# Patient Record
Sex: Female | Born: 1955 | Race: White | Hispanic: No | Marital: Married | Smoking: Never smoker
Health system: Southern US, Community
[De-identification: ages and names within clinical notes are randomized; demographics above are authoritative.]

## PROBLEM LIST (undated history)

## (undated) DIAGNOSIS — T7840XA Allergy, unspecified, initial encounter: Secondary | ICD-10-CM

## (undated) HISTORY — PX: DILATION AND CURETTAGE OF UTERUS: SHX78

## (undated) HISTORY — DX: Allergy, unspecified, initial encounter: T78.40XA

---

## 2002-08-29 DIAGNOSIS — J309 Allergic rhinitis, unspecified: Secondary | ICD-10-CM | POA: Insufficient documentation

## 2004-01-29 DIAGNOSIS — C760 Malignant neoplasm of head, face and neck: Secondary | ICD-10-CM | POA: Insufficient documentation

## 2005-05-21 ENCOUNTER — Ambulatory Visit: Payer: Self-pay | Admitting: Family Medicine

## 2005-06-04 ENCOUNTER — Ambulatory Visit: Payer: Self-pay | Admitting: Family Medicine

## 2006-01-16 ENCOUNTER — Ambulatory Visit: Payer: Self-pay | Admitting: Family Medicine

## 2006-07-31 ENCOUNTER — Ambulatory Visit: Payer: Self-pay | Admitting: Family Medicine

## 2006-08-12 ENCOUNTER — Ambulatory Visit: Payer: Self-pay | Admitting: Family Medicine

## 2007-03-10 ENCOUNTER — Ambulatory Visit: Payer: Self-pay | Admitting: Family Medicine

## 2007-11-04 ENCOUNTER — Ambulatory Visit: Payer: Self-pay | Admitting: Family Medicine

## 2008-05-24 ENCOUNTER — Ambulatory Visit: Payer: Self-pay | Admitting: Family Medicine

## 2009-06-13 ENCOUNTER — Ambulatory Visit: Payer: Self-pay | Admitting: Family Medicine

## 2009-09-05 ENCOUNTER — Ambulatory Visit: Payer: Self-pay | Admitting: Family Medicine

## 2009-12-13 ENCOUNTER — Ambulatory Visit: Payer: Self-pay | Admitting: Unknown Physician Specialty

## 2010-08-14 ENCOUNTER — Ambulatory Visit: Payer: Self-pay | Admitting: Family Medicine

## 2011-10-08 ENCOUNTER — Ambulatory Visit: Payer: Self-pay | Admitting: Family Medicine

## 2012-04-14 ENCOUNTER — Ambulatory Visit: Payer: Self-pay | Admitting: Family Medicine

## 2012-12-08 ENCOUNTER — Ambulatory Visit: Payer: Self-pay | Admitting: Family Medicine

## 2014-01-18 ENCOUNTER — Ambulatory Visit: Payer: Self-pay | Admitting: Family Medicine

## 2015-03-02 ENCOUNTER — Other Ambulatory Visit: Payer: Self-pay | Admitting: Family Medicine

## 2015-03-02 DIAGNOSIS — Z1231 Encounter for screening mammogram for malignant neoplasm of breast: Secondary | ICD-10-CM

## 2015-03-10 DIAGNOSIS — G43909 Migraine, unspecified, not intractable, without status migrainosus: Secondary | ICD-10-CM | POA: Insufficient documentation

## 2015-03-10 DIAGNOSIS — K649 Unspecified hemorrhoids: Secondary | ICD-10-CM | POA: Insufficient documentation

## 2015-03-14 ENCOUNTER — Ambulatory Visit: Payer: Self-pay

## 2015-04-04 ENCOUNTER — Ambulatory Visit
Admission: RE | Admit: 2015-04-04 | Discharge: 2015-04-04 | Disposition: A | Discharge status: Home / Self Care | Payer: BLUE CROSS/BLUE SHIELD | Source: Ambulatory Visit | Attending: Family Medicine | Admitting: Family Medicine

## 2015-04-04 DIAGNOSIS — Z1231 Encounter for screening mammogram for malignant neoplasm of breast: Secondary | ICD-10-CM | POA: Diagnosis not present

## 2015-04-21 ENCOUNTER — Other Ambulatory Visit: Payer: Self-pay | Admitting: Family Medicine

## 2015-04-21 DIAGNOSIS — G43909 Migraine, unspecified, not intractable, without status migrainosus: Secondary | ICD-10-CM

## 2015-05-23 ENCOUNTER — Encounter: Payer: Self-pay | Admitting: Family Medicine

## 2015-05-23 ENCOUNTER — Ambulatory Visit (INDEPENDENT_AMBULATORY_CARE_PROVIDER_SITE_OTHER): Payer: BLUE CROSS/BLUE SHIELD | Admitting: Family Medicine

## 2015-05-23 VITALS — BP 102/64 | HR 72 | Temp 98.0°F | Resp 16 | Ht 66.0 in | Wt 140.0 lb

## 2015-05-23 DIAGNOSIS — G43009 Migraine without aura, not intractable, without status migrainosus: Secondary | ICD-10-CM | POA: Diagnosis not present

## 2015-05-23 DIAGNOSIS — Z0000 Encounter for general adult medical examination without abnormal findings: Secondary | ICD-10-CM | POA: Diagnosis not present

## 2015-05-23 DIAGNOSIS — R319 Hematuria, unspecified: Secondary | ICD-10-CM

## 2015-05-23 DIAGNOSIS — R1032 Left lower quadrant pain: Secondary | ICD-10-CM | POA: Diagnosis not present

## 2015-05-23 LAB — POCT URINALYSIS DIPSTICK
BILIRUBIN UA: NEGATIVE
Glucose, UA: NEGATIVE
KETONES UA: NEGATIVE
LEUKOCYTES UA: NEGATIVE
Nitrite, UA: NEGATIVE
PH UA: 6
Protein, UA: NEGATIVE
SPEC GRAV UA: 1.02
Urobilinogen, UA: 0.2

## 2015-05-23 NOTE — Progress Notes (Signed)
Patient ID: AMEY HOSSAIN, female   DOB: 05-28-1956, 59 y.o.   MRN: [id number]        Patient: Megan Jensen, Female    DOB: [birth year], 59 y.o.   MRN: [id number] Visit Date: 05/23/2015  Today's Provider: Margarita Rana, MD   Chief Complaint  Patient presents with  . Annual Exam  . Headache   Subjective:    Annual physical exam Megan Jensen is a 59 y.o. female who presents today for health maintenance and complete physical. She feels well. She reports exercising 3-4 times a week. She reports she is sleeping well.  12/11/13 CPE 04/04/15 Mammogram-normal 05/26/12 Pap-neg; HPV-neg 12/13/09 Colonoscopy-WNL, recheck in 10 yrs 09/05/09 BMD-normal    .    Migraine   The onset of her migraine headaches has been gradual and they have been occuring in an  intermittent pattern. The course has been increasing. Patient describes her symptoms as  severe and worsening for the past few months.   Also concerned about left lower quadrant pain. Had it for several months, resolved and now is back.   -----------------------------------------------------------------   Review of Systems  Constitutional: Negative.   Eyes: Positive for photophobia.  Respiratory: Negative.   Cardiovascular: Negative.   Gastrointestinal: Negative.   Endocrine: Negative.   Genitourinary: Negative.   Musculoskeletal: Negative.   Skin: Negative.   Allergic/Immunologic: Negative.   Neurological: Positive for headaches.  Hematological: Negative.   Psychiatric/Behavioral: Negative.     Social History She  reports that she has never smoked. She has never used smokeless tobacco. She reports that she drinks alcohol. She reports that she does not use illicit drugs. Social History   Social History  . Marital Status: Married    Spouse Name: Printmaker  . Number of Children: 3  . Years of Education: N/A   Social History Main Topics  . Smoking status: Never Smoker   . Smokeless tobacco: Never Used  .  Alcohol Use: Yes     Comment: occasional  . Drug Use: No  . Sexual Activity: Not Asked   Other Topics Concern  . None   Social History Narrative    Patient Active Problem List   Diagnosis Date Noted  . Hemorrhoid 03/10/2015  . Headache, migraine 03/10/2015  . 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 Allergic rhinitis 08/29/2002    Past Surgical History  Procedure Laterality Date  . Cesarean section  1987  . Dilation and curettage of uterus      times 3     Family History  Family Status  Relation Status Death Age  . Mother Alive   . Father Alive   . Sister Alive   . Brother Alive    Her family history includes Arthritis in her father and mother; Hyperlipidemia in her mother; Hypertension in her father; Sleep apnea in her sister.    No Known Allergies  Previous Medications   HYDROCORTISONE (ANUSOL-HC) 25 MG SUPPOSITORY    Place rectally 2 (two) times daily as needed.    IBUPROFEN (ADVIL,MOTRIN) 800 MG TABLET    TAKE ONE TABLET BY MOUTH THREE TIMES A DAY AS NEEDED FOR HEADACHE   SUMATRIPTAN (IMITREX) 50 MG TABLET    Take 50 mg by mouth every 2 (two) hours as needed for migraine.     Patient Care Team: Margarita Rana, MD as PCP - General (Family Medicine)     Objective:   Vitals: BP 102/64 mmHg  Pulse 72  Temp(Src) 98 F (36.7  C)  Resp 16  Ht 5\' 6"  (1.676 m)  Wt 140 lb (63.504 kg)  BMI 22.61 kg/m2  SpO2 98%   Physical Exam  Constitutional: She is oriented to person, place, and time. She appears well-developed and well-nourished.  HENT:  Head: Normocephalic and atraumatic.  Right Ear: Tympanic membrane, external ear and ear canal normal.  Left Ear: Tympanic membrane, external ear and ear canal normal.  Nose: Nose normal.  Mouth/Throat: Uvula is midline, oropharynx is clear and moist and mucous membranes are normal.  Eyes: Conjunctivae, EOM and lids are normal. Pupils are equal, round, and reactive to light.  Neck: Trachea normal and normal range of motion. Neck  supple. Carotid bruit is not present. No thyroid mass and no thyromegaly present.  Cardiovascular: Normal rate, regular rhythm and normal heart sounds.   Pulmonary/Chest: Effort normal and breath sounds normal.  Abdominal: Soft. Normal appearance and bowel sounds are normal. There is no hepatosplenomegaly. There is no tenderness.  Genitourinary: No breast swelling, tenderness or discharge.  Musculoskeletal: Normal range of motion.  Lymphadenopathy:    She has no cervical adenopathy.    She has no axillary adenopathy.  Neurological: She is alert and oriented to person, place, and time. She has normal strength. No cranial nerve deficit.  Skin: Skin is warm, dry and intact.  Psychiatric: She has a normal mood and affect. Her speech is normal and behavior is normal. Judgment and thought content normal. Cognition and memory are normal.     Depression Screen PHQ 2/9 Scores 05/23/2015  PHQ - 2 Score 0      Assessment & Plan:     Routine Health Maintenance and Physical Exam  Exercise Activities and Dietary recommendations Goals    . Exercise 150 minutes per week (moderate activity)       Immunization History  Administered Date(s) Administered  . Tdap 03/16/2014    Health Maintenance  Topic Date Due  . Hepatitis C Screening  January 21, 1956  . HIV Screening  04/18/1971  . COLONOSCOPY  04/17/2006  . INFLUENZA VACCINE  04/30/2015  . MAMMOGRAM  04/03/2017  . TETANUS/TDAP  03/16/2024      1. Annual physical exam Continue to eat healthy and exercise.  - POCT urinalysis dipstick - CBC with Differential/Platelet - Comprehensive metabolic panel - Lipid Panel With LDL/HDL Ratio - TSH Results for orders placed or performed in visit on 05/23/15  POCT urinalysis dipstick  Result Value Ref Range   Color, UA straw    Clarity, UA clear    Glucose, UA neg    Bilirubin, UA neg    Ketones, UA neg    Spec Grav, UA 1.020    Blood, UA small    pH, UA 6.0    Protein, UA neg     Urobilinogen, UA 0.2    Nitrite, UA neg    Leukocytes, UA Negative Negative    2. Hematuria Will send to lab for evaluation.  - Urine Microscopic  3. Left lower quadrant pain Intermittent. No signs of  Diverticulosis on colonoscopy.  Will check ultrasound.  - US Pelvis Complete; Future  4. Migraine without aura and without status migrainosus, not intractable Worsening again. Will check sed rate. Would like patient to see a specialist. Going to talk with her daughter about who and we will refer.   - Sedimentation rate  Patient was seen and examined by Jerrell Belfast, MD, and note scribed by Lynford Humphrey, [place].   I have reviewed the document for  accuracy and completeness and I agree with above. Jerrell Belfast, MD     --------------------------------------------------------------------

## 2015-05-24 ENCOUNTER — Telehealth: Payer: Self-pay | Admitting: Family Medicine

## 2015-05-24 ENCOUNTER — Telehealth: Payer: Self-pay

## 2015-05-24 DIAGNOSIS — R102 Pelvic and perineal pain: Secondary | ICD-10-CM | POA: Insufficient documentation

## 2015-05-24 LAB — URINALYSIS, MICROSCOPIC ONLY: Casts: NONE SEEN /lpf

## 2015-05-24 NOTE — Telephone Encounter (Signed)
-----   Message from Margarita Rana, MD sent at 05/24/2015  7:39 AM EDT ----- Urine micro normal. Please notify patient. Thanks.

## 2015-05-24 NOTE — Telephone Encounter (Signed)
Pt advised.   Thanks,   -Laura  

## 2015-05-24 NOTE — Telephone Encounter (Signed)
Please add order for transvaginal ultrasound unless there is a reason pt should not have it done.This is usually done along with pelvic ultrasound

## 2015-05-29 ENCOUNTER — Ambulatory Visit
Admission: RE | Admit: 2015-05-29 | Discharge: 2015-05-29 | Disposition: A | Discharge status: Home / Self Care | Payer: BLUE CROSS/BLUE SHIELD | Source: Ambulatory Visit | Attending: Family Medicine | Admitting: Family Medicine

## 2015-05-29 DIAGNOSIS — R1032 Left lower quadrant pain: Secondary | ICD-10-CM | POA: Diagnosis not present

## 2015-05-29 DIAGNOSIS — R102 Pelvic and perineal pain: Secondary | ICD-10-CM

## 2015-05-30 ENCOUNTER — Telehealth: Payer: Self-pay

## 2015-05-30 NOTE — Telephone Encounter (Signed)
Pt advised.   Thanks,   -Kimie Pidcock  

## 2015-05-30 NOTE — Telephone Encounter (Signed)
-----   Message from Margarita Rana, MD sent at 05/29/2015  3:53 PM EDT ----- Normal pelvic ultrasound. Thanks.

## 2015-06-07 LAB — CBC WITH DIFFERENTIAL/PLATELET
BASOS: 1 %
Basophils Absolute: 0 10*3/uL (ref 0.0–0.2)
EOS (ABSOLUTE): 0.2 10*3/uL (ref 0.0–0.4)
Eos: 4 %
HEMOGLOBIN: 14.3 g/dL (ref 11.1–15.9)
Hematocrit: 42.2 % (ref 34.0–46.6)
IMMATURE GRANS (ABS): 0 10*3/uL (ref 0.0–0.1)
Immature Granulocytes: 0 %
LYMPHS ABS: 1.8 10*3/uL (ref 0.7–3.1)
LYMPHS: 39 %
MCH: 30.5 pg (ref 26.6–33.0)
MCHC: 33.9 g/dL (ref 31.5–35.7)
MCV: 90 fL (ref 79–97)
MONOCYTES: 11 %
Monocytes Absolute: 0.5 10*3/uL (ref 0.1–0.9)
NEUTROS ABS: 2.1 10*3/uL (ref 1.4–7.0)
Neutrophils: 45 %
Platelets: 205 10*3/uL (ref 150–379)
RBC: 4.69 x10E6/uL (ref 3.77–5.28)
RDW: 13.6 % (ref 12.3–15.4)
WBC: 4.6 10*3/uL (ref 3.4–10.8)

## 2015-06-07 LAB — COMPREHENSIVE METABOLIC PANEL
A/G RATIO: 1.9 (ref 1.1–2.5)
ALBUMIN: 4.6 g/dL (ref 3.5–5.5)
ALT: 29 IU/L (ref 0–32)
AST: 24 IU/L (ref 0–40)
Alkaline Phosphatase: 66 IU/L (ref 39–117)
BILIRUBIN TOTAL: 0.5 mg/dL (ref 0.0–1.2)
BUN / CREAT RATIO: 16 (ref 9–23)
BUN: 13 mg/dL (ref 6–24)
CHLORIDE: 100 mmol/L (ref 97–108)
CO2: 25 mmol/L (ref 18–29)
Calcium: 9.5 mg/dL (ref 8.7–10.2)
Creatinine, Ser: 0.81 mg/dL (ref 0.57–1.00)
GFR calc Af Amer: 92 mL/min/{1.73_m2} (ref >59)
GFR calc non Af Amer: 80 mL/min/{1.73_m2} (ref >59)
Globulin, Total: 2.4 g/dL (ref 1.5–4.5)
Glucose: 101 mg/dL — ABNORMAL HIGH (ref 65–99)
POTASSIUM: 4.3 mmol/L (ref 3.5–5.2)
Sodium: 141 mmol/L (ref 134–144)
TOTAL PROTEIN: 7 g/dL (ref 6.0–8.5)

## 2015-06-07 LAB — TSH: TSH: 5.36 u[IU]/mL — AB (ref 0.450–4.500)

## 2015-06-07 LAB — LIPID PANEL WITH LDL/HDL RATIO
Cholesterol, Total: 253 mg/dL — ABNORMAL HIGH (ref 100–199)
HDL: 63 mg/dL (ref >39)
LDL Calculated: 157 mg/dL — ABNORMAL HIGH (ref 0–99)
LDL/HDL RATIO: 2.5 ratio (ref 0.0–3.2)
Triglycerides: 164 mg/dL — ABNORMAL HIGH (ref 0–149)
VLDL CHOLESTEROL CAL: 33 mg/dL (ref 5–40)

## 2015-06-07 LAB — SEDIMENTATION RATE: Sed Rate: 2 mm/hr (ref 0–40)

## 2015-06-11 ENCOUNTER — Telehealth: Payer: Self-pay

## 2015-06-11 NOTE — Telephone Encounter (Signed)
LMTCB 06/11/2015  Thanks,   -Mickel Baas

## 2015-06-11 NOTE — Telephone Encounter (Signed)
Pt advised as directed below.  She will call back to get her lab slip for her TSH & T4 in about 6 weeks.   Thanks,   -Mickel Baas

## 2015-06-11 NOTE — Telephone Encounter (Signed)
Pt returned call and request that she be called back after 415 this afternoon. Thanks TNP

## 2015-06-11 NOTE — Telephone Encounter (Signed)
-----   Message from Margarita Rana, MD sent at 06/07/2015  1:43 PM EDT ----- Lab ok  With some minor abnormalities. Blood sugar slightly above normal. Recheck in 6 months for stability.  Cholesterol is elevated at 253 but has high good cholesterol and 10 year risk of heart disease is just 2.5% so medication in not recommended at this time.  Sed rate normal.  Thyroid very slightly low. Would recheck tsh and t4 in 6 weeks. Sometimes improves on it's own. Thanks.

## 2015-07-03 ENCOUNTER — Other Ambulatory Visit: Payer: Self-pay

## 2015-07-03 DIAGNOSIS — G43009 Migraine without aura, not intractable, without status migrainosus: Secondary | ICD-10-CM

## 2015-07-03 MED ORDER — SUMATRIPTAN SUCCINATE 50 MG PO TABS: ORAL_TABLET | ORAL | Status: DC

## 2015-10-11 ENCOUNTER — Ambulatory Visit (INDEPENDENT_AMBULATORY_CARE_PROVIDER_SITE_OTHER): Payer: BLUE CROSS/BLUE SHIELD | Admitting: Physician Assistant

## 2015-10-11 ENCOUNTER — Encounter: Payer: Self-pay | Admitting: Physician Assistant

## 2015-10-11 VITALS — BP 120/80 | HR 82 | Temp 98.2°F | Resp 16 | Wt 142.6 lb

## 2015-10-11 DIAGNOSIS — R05 Cough: Secondary | ICD-10-CM | POA: Diagnosis not present

## 2015-10-11 DIAGNOSIS — R946 Abnormal results of thyroid function studies: Secondary | ICD-10-CM

## 2015-10-11 DIAGNOSIS — J40 Bronchitis, not specified as acute or chronic: Secondary | ICD-10-CM

## 2015-10-11 DIAGNOSIS — R059 Cough, unspecified: Secondary | ICD-10-CM

## 2015-10-11 MED ORDER — HYDROCODONE-HOMATROPINE 5-1.5 MG/5ML PO SYRP: 5.0000 mL | ORAL_SOLUTION | Freq: Three times a day (TID) | ORAL | Status: DC | PRN

## 2015-10-11 MED ORDER — AZITHROMYCIN 250 MG PO TABS: ORAL_TABLET | ORAL | Status: DC

## 2015-10-11 NOTE — Progress Notes (Signed)
Patient: Megan Jensen Female    DOB: [birth year]   60 y.o.   MRN: [id number] Visit Date: 10/11/2015  Today's Provider: Mar Daring, PA-C   Chief Complaint  Patient presents with  . Cough   Subjective:    Cough This is a new problem. The current episode started 1 to 4 weeks ago. The problem has been gradually worsening. The problem occurs constantly. The cough is productive of sputum (Greenish). Associated symptoms include postnasal drip. Pertinent negatives include no chest pain, chills, ear congestion, ear pain, fever, headaches, nasal congestion, rhinorrhea, sore throat (scratchy), shortness of breath or wheezing. The symptoms are aggravated by lying down (Talking). She has tried OTC cough suppressant (Mucinex) for the symptoms. The treatment provided no relief.  Patient got her Influenza vaccine at work. 06/2015     No Known Allergies Previous Medications   HYDROCORTISONE (ANUSOL-HC) 25 MG SUPPOSITORY    Place rectally 2 (two) times daily as needed.    IBUPROFEN (ADVIL,MOTRIN) 800 MG TABLET    TAKE ONE TABLET BY MOUTH THREE TIMES A DAY AS NEEDED FOR HEADACHE   SUMATRIPTAN (IMITREX) 50 MG TABLET    Take one tablet at first sign of migraine symptoms.  If no relief, a second tablet may be taken in 2 hours limit 2/Day 5/Week.    Review of Systems  Constitutional: Positive for fatigue. Negative for fever and chills.  HENT: Positive for postnasal drip and sinus pressure. Negative for ear pain, rhinorrhea, sneezing and sore throat (scratchy).   Respiratory: Positive for cough. Negative for chest tightness, shortness of breath and wheezing.   Cardiovascular: Negative for chest pain.  Gastrointestinal: Negative for nausea, vomiting and abdominal pain.  Neurological: Negative for dizziness and headaches.    Social History  Substance Use Topics  . Smoking status: Never Smoker   . Smokeless tobacco: Never Used  . Alcohol Use: Yes     Comment: occasional   Objective:     BP 120/80 mmHg  Pulse 82  Temp(Src) 98.2 F (36.8 C) (Oral)  Resp 16  Wt 142 lb 9.6 oz (64.683 kg)  Physical Exam  Constitutional: She appears well-developed and well-nourished. No distress.  HENT:  Head: Normocephalic and atraumatic.  Right Ear: Hearing, tympanic membrane, external ear and ear canal normal.  Left Ear: Hearing, tympanic membrane, external ear and ear canal normal.  Nose: Mucosal edema and rhinorrhea present. Right sinus exhibits no maxillary sinus tenderness and no frontal sinus tenderness. Left sinus exhibits no maxillary sinus tenderness and no frontal sinus tenderness.  Mouth/Throat: Uvula is midline, oropharynx is clear and moist and mucous membranes are normal. No oropharyngeal exudate, posterior oropharyngeal edema or posterior oropharyngeal erythema.  Eyes: Conjunctivae are normal. Pupils are equal, round, and reactive to light. Right eye exhibits no discharge. Left eye exhibits no discharge. No scleral icterus.  Neck: Normal range of motion. Neck supple. No tracheal deviation present. No thyromegaly present.  Cardiovascular: Normal rate, regular rhythm and normal heart sounds.  Exam reveals no gallop and no friction rub.   No murmur heard. Pulmonary/Chest: Effort normal. No stridor. No respiratory distress. She has no decreased breath sounds. She has no wheezes. She has no rhonchi. She has rales in the right upper field and the right middle field.  Lymphadenopathy:    She has no cervical adenopathy.  Skin: Skin is warm and dry. She is not diaphoretic.  Vitals reviewed.       Assessment & Plan:  1. Bronchitis Worsening symptoms. I will be with a Z-Pak as below. I did advise that she may take Delsym for cough suppression during the day. She needs to make sure to stay well-hydrated and to include plenty of rest. She is to call the office if symptoms don't improve or worsen. - azithromycin (ZITHROMAX) 250 MG tablet; Take 2 tablets PO on day one, and one  tablet PO daily thereafter until completed.  Dispense: 6 tablet; Refill: 0  2. Cough Worsening nighttime cough that is affecting her sleep. I will prescribe Hycodan cough syrup as below. She is to call the office if her cough worsens or if she develops any wheezing or shortness of breath. May consider chest x-ray if cough worsens. She is to call the office if she does not improve following treatment. - HYDROcodone-homatropine (HYCODAN) 5-1.5 MG/5ML syrup; Take 5 mLs by mouth every 8 (eight) hours as needed for cough.  Dispense: 240 mL; Refill: 0  3. Abnormal results of thyroid function studies She does have a history of abnormal TSH on most recent lab draw that was done with her annual physical. She was to have her thyroid panel repeated in November but felt to have this done and would like to have it checked now. I will order thyroid panel with TSH as below and follow-up pending these results. - Thyroid Panel With TSH       Mar Daring, PA-C  [facility]

## 2015-11-30 ENCOUNTER — Other Ambulatory Visit: Payer: Self-pay | Admitting: Family Medicine

## 2015-11-30 DIAGNOSIS — G43009 Migraine without aura, not intractable, without status migrainosus: Secondary | ICD-10-CM

## 2015-12-13 ENCOUNTER — Telehealth: Payer: Self-pay

## 2015-12-13 LAB — THYROID PANEL WITH TSH
Free Thyroxine Index: 2.2 (ref 1.2–4.9)
T3 Uptake Ratio: 30 % (ref 24–39)
T4 TOTAL: 7.3 ug/dL (ref 4.5–12.0)
TSH: 2.22 u[IU]/mL (ref 0.450–4.500)

## 2015-12-13 NOTE — Telephone Encounter (Signed)
-----   Message from Mar Daring, PA-C sent at 12/13/2015  9:40 AM EDT ----- TSH and thyroid panel are all WNL.

## 2015-12-13 NOTE — Telephone Encounter (Signed)
Patient advised as directed below. 

## 2016-02-11 ENCOUNTER — Other Ambulatory Visit: Payer: Self-pay | Admitting: Family Medicine

## 2016-02-11 DIAGNOSIS — G43009 Migraine without aura, not intractable, without status migrainosus: Secondary | ICD-10-CM

## 2016-03-06 ENCOUNTER — Other Ambulatory Visit: Payer: Self-pay | Admitting: Family Medicine

## 2016-03-06 DIAGNOSIS — G43009 Migraine without aura, not intractable, without status migrainosus: Secondary | ICD-10-CM

## 2016-03-17 ENCOUNTER — Other Ambulatory Visit: Payer: Self-pay | Admitting: Family Medicine

## 2016-03-17 DIAGNOSIS — K649 Unspecified hemorrhoids: Secondary | ICD-10-CM

## 2016-04-17 ENCOUNTER — Ambulatory Visit (INDEPENDENT_AMBULATORY_CARE_PROVIDER_SITE_OTHER): Payer: BLUE CROSS/BLUE SHIELD | Admitting: Physician Assistant

## 2016-04-17 ENCOUNTER — Encounter: Payer: Self-pay | Admitting: Physician Assistant

## 2016-04-17 VITALS — BP 110/78 | HR 80 | Temp 98.3°F | Resp 16 | Wt 142.8 lb

## 2016-04-17 DIAGNOSIS — H6123 Impacted cerumen, bilateral: Secondary | ICD-10-CM | POA: Diagnosis not present

## 2016-04-17 NOTE — Progress Notes (Signed)
       Patient: Megan Jensen Female    DOB: May 06, 1956   60 y.o.   MRN: [id number] Visit Date: 04/17/2016  Today's Provider: Mar Daring, PA-C   Chief Complaint  Patient presents with  . Ear Fullness   Subjective:    HPI Ear Fullness: Patient reports that her left ear feels like there's water or wax stuck. [place] and has had the sensation since. She denies pain. Does have muffled hearing. Did use a wax removal drop in left ear only. Is flying to [place] next week and wanted to make sure that her ear drums were ok. No fevers, chills, nausea, vomiting or URI symptoms.    No Known Allergies Current Meds  Medication Sig  . ANUCORT-HC 25 MG suppository INSERT 1 RECTALLY TWICE A DAY  . ibuprofen (ADVIL,MOTRIN) 800 MG tablet TAKE ONE TABLET BY MOUTH THREE TIMES A DAY AS NEEDED FOR HEADACHE  . SUMAtriptan (IMITREX) 50 MG tablet TAKE ONE TABLET AT FIRST SIGN OF MIGRAINE SYMPTOMS--IF NO RELIEF, A SECOND TABLET MAY BE TAKEN IN 2 HOURS    Review of Systems  Constitutional: Negative.   HENT: Positive for ear pain (fullness) and hearing loss (muffled on left). Negative for ear discharge, postnasal drip, rhinorrhea, sinus pressure, sneezing, sore throat, tinnitus and trouble swallowing.   Respiratory: Negative.   Cardiovascular: Negative.   Gastrointestinal: Negative.   Neurological: Negative for dizziness and headaches.    Social History  Substance Use Topics  . Smoking status: Never Smoker   . Smokeless tobacco: Never Used  . Alcohol Use: Yes     Comment: occasional   Objective:   BP 110/78 mmHg  Pulse 80  Temp(Src) 98.3 F (36.8 C) (Oral)  Resp 16  Wt 142 lb 12.8 oz (64.774 kg)  Physical Exam  Constitutional: She appears well-developed and well-nourished. No distress.  HENT:  Head: Normocephalic and atraumatic.  Right Ear: Hearing and external ear normal.  Left Ear: Hearing, tympanic membrane, external ear and ear canal normal.  Nose:  Nose normal.  Mouth/Throat: Uvula is midline, oropharynx is clear and moist and mucous membranes are normal. No oropharyngeal exudate.  Cerumen impaction noted bilaterally. Ear lavage was successful on the left and TM was normal. Unable to remove wax on the right.  Eyes: Conjunctivae are normal. Pupils are equal, round, and reactive to light. Right eye exhibits no discharge. Left eye exhibits no discharge. No scleral icterus.  Neck: Normal range of motion. Neck supple. No tracheal deviation present. No thyromegaly present.  Cardiovascular: Normal rate, regular rhythm and normal heart sounds.  Exam reveals no gallop and no friction rub.   No murmur heard. Pulmonary/Chest: Effort normal and breath sounds normal. No stridor. No respiratory distress. She has no wheezes. She has no rales.  Lymphadenopathy:    She has no cervical adenopathy.  Skin: Skin is warm and dry. She is not diaphoretic.  Vitals reviewed.     Assessment & Plan:     1. Bilateral impacted cerumen Lavage on the left was successful. Right unsuccessful. Will have her statrt DeBrox drops in the right ear through the weekend. I will see her back Monday to recheck right ear and remove wax if still in place.  - Ear Lavage       Mar Daring, PA-C  [place]

## 2016-04-17 NOTE — Patient Instructions (Signed)
Cerumen Impaction The structures of the external ear canal secrete a waxy substance known as cerumen. Excess cerumen can build up in the ear canal, causing a condition known as cerumen impaction. Cerumen impaction can cause ear pain and disrupt the function of the ear. The rate of cerumen production differs for each individual. In certain individuals, the configuration of the ear canal may decrease his or her ability to naturally remove cerumen. CAUSES Cerumen impaction is caused by excessive cerumen production or buildup. RISK FACTORS  Frequent use of swabs to clean ears.  Having narrow ear canals.  Having eczema.  Being dehydrated. SIGNS AND SYMPTOMS  Diminished hearing.  Ear drainage.  Ear pain.  Ear itch. TREATMENT Treatment may involve:  Over-the-counter or prescription ear drops to soften the cerumen.  Removal of cerumen by a health care provider. This may be done with:  Irrigation with warm water. This is the most common method of removal.  Ear curettes and other instruments.  Surgery. This may be done in severe cases. HOME CARE INSTRUCTIONS  Take medicines only as directed by your health care provider.  Do not insert objects into the ear with the intent of cleaning the ear. PREVENTION  Do not insert objects into the ear, even with the intent of cleaning the ear. Removing cerumen as a part of normal hygiene is not necessary, and the use of swabs in the ear canal is not recommended.  Drink enough water to keep your urine clear or pale yellow.  Control your eczema if you have it. SEEK MEDICAL CARE IF:  You develop ear pain.  You develop bleeding from the ear.  The cerumen does not clear after you use ear drops as directed.   This information is not intended to replace advice given to you by your health care provider. Make sure you discuss any questions you have with your health care provider.   Document Released: 10/23/2004 Document Revised: 10/06/2014  Document Reviewed: 05/02/2015 Elsevier Interactive Patient Education 2016 Elsevier Inc. Carbamide Peroxide ear solution What is this medicine? CARBAMIDE PEROXIDE (CAR bah mide per OX ide) is used to soften and help remove ear wax. This medicine may be used for other purposes; ask your health care provider or pharmacist if you have questions. What should I tell my health care provider before I take this medicine? They need to know if you have any of these conditions: -dizziness -ear discharge -ear pain, irritation or rash -infection -perforated eardrum (hole in eardrum) -an unusual or allergic reaction to carbamide peroxide, glycerin, hydrogen peroxide, other medicines, foods, dyes, or preservatives -pregnant or trying to get pregnant -breast-feeding How should I use this medicine? This medicine is only for use in the outer ear canal. Follow the directions carefully. Wash hands before and after use. The solution may be warmed by holding the bottle in the hand for 1 to 2 minutes. Lie with the affected ear facing upward. Place the proper number of drops into the ear canal. After the drops are instilled, remain lying with the affected ear upward for 5 minutes to help the drops stay in the ear canal. A cotton ball may be gently inserted at the ear opening for no longer than 5 to 10 minutes to ensure retention. Repeat, if necessary, for the opposite ear. Do not touch the tip of the dropper to the ear, fingertips, or other surface. Do not rinse the dropper after use. Keep container tightly closed. Talk to your pediatrician regarding the use of this medicine   in children. While this drug may be used in children as young as 12 years for selected conditions, precautions do apply. Overdosage: If you think you have taken too much of this medicine contact a poison control center or emergency room at once. NOTE: This medicine is only for you. Do not share this medicine with others. What if I miss a dose? If  you miss a dose, use it as soon as you can. If it is almost time for your next dose, use only that dose. Do not use double or extra doses. What may interact with this medicine? Interactions are not expected. Do not use any other ear products without asking your doctor or health care professional. This list may not describe all possible interactions. Give your health care provider a list of all the medicines, herbs, non-prescription drugs, or dietary supplements you use. Also tell them if you smoke, drink alcohol, or use illegal drugs. Some items may interact with your medicine. What should I watch for while using this medicine? This medicine is not for long-term use. Do not use for more than 4 days without checking with your health care professional. Contact your doctor or health care professional if your condition does not start to get better within a few days or if you notice burning, redness, itching or swelling. What side effects may I notice from receiving this medicine? Side effects that you should report to your doctor or health care professional as soon as possible: -allergic reactions like skin rash, itching or hives, swelling of the face, lips, or tongue -burning, itching, and redness -worsening ear pain -rash Side effects that usually do not require medical attention (report to your doctor or health care professional if they continue or are bothersome): -abnormal sensation while putting the drops in the ear -temporary reduction in hearing (but not complete loss of hearing) This list may not describe all possible side effects. Call your doctor for medical advice about side effects. You may report side effects to FDA at 1-800-FDA-1088. Where should I keep my medicine? Keep out of the reach of children. Store at room temperature between 15 and 30 degrees C (59 and 86 degrees F) in a tight, light-resistant container. Keep bottle away from excessive heat and direct sunlight. Throw away any  unused medicine after the expiration date. NOTE: This sheet is a summary. It may not cover all possible information. If you have questions about this medicine, talk to your doctor, pharmacist, or health care provider.    2016, Elsevier/Gold Standard. (2007-12-28 14:00:02)  

## 2016-04-21 ENCOUNTER — Ambulatory Visit (INDEPENDENT_AMBULATORY_CARE_PROVIDER_SITE_OTHER): Payer: BLUE CROSS/BLUE SHIELD | Admitting: Physician Assistant

## 2016-04-21 ENCOUNTER — Encounter: Payer: Self-pay | Admitting: Physician Assistant

## 2016-04-21 VITALS — BP 116/76 | HR 66 | Temp 97.8°F | Wt 145.0 lb

## 2016-04-21 DIAGNOSIS — H6123 Impacted cerumen, bilateral: Secondary | ICD-10-CM

## 2016-04-21 NOTE — Progress Notes (Signed)
       Patient: Megan Jensen Female    DOB: 1956-01-14   60 y.o.   MRN: [id number] Visit Date: 04/21/2016  Today's Provider: Mar Daring, PA-C   Chief Complaint  Patient presents with  . Cerumen Impaction   Subjective:    HPI Pt is here for a a 4 day follow up of cerumen impaction. She has cerumen impacted in both ears but the left ear lavage was successful the right ear was not. She was to try Debrox over the weekend and recheck today for lavage. She reports that she has been doing the Debrox but has not seen any wax come out of her ear. She reports that the right ear never felt full.     No Known Allergies Current Meds  Medication Sig  . ANUCORT-HC 25 MG suppository INSERT 1 RECTALLY TWICE A DAY  . ibuprofen (ADVIL,MOTRIN) 800 MG tablet TAKE ONE TABLET BY MOUTH THREE TIMES A DAY AS NEEDED FOR HEADACHE  . SUMAtriptan (IMITREX) 50 MG tablet TAKE ONE TABLET AT FIRST SIGN OF MIGRAINE SYMPTOMS--IF NO RELIEF, A SECOND TABLET MAY BE TAKEN IN 2 HOURS    Review of Systems  Constitutional: Negative.   HENT: Positive for congestion, rhinorrhea and sore throat.   Eyes: Negative.   Respiratory: Positive for cough.   Cardiovascular: Negative.   Gastrointestinal: Negative.   Endocrine: Negative.   Genitourinary: Negative.   Musculoskeletal: Negative.   Allergic/Immunologic: Negative.   Neurological: Negative.   Hematological: Negative.   Psychiatric/Behavioral: Negative.     Social History  Substance Use Topics  . Smoking status: Never Smoker  . Smokeless tobacco: Never Used  . Alcohol use Yes     Comment: occasional   Objective:   BP 116/76 (BP Location: Right Arm, Patient Position: Sitting, Cuff Size: Normal)   Pulse 66   Temp 97.8 F (36.6 C) (Oral)   Wt 145 lb (65.8 kg)   BMI 23.40 kg/m   Physical Exam  Constitutional: She appears well-developed and well-nourished. No distress.  HENT:  Head: Normocephalic and atraumatic.  Right Ear: Hearing, tympanic  membrane, external ear and ear canal normal.  Left Ear: Hearing, tympanic membrane, external ear and ear canal normal.  Nose: Nose normal.  Mouth/Throat: Uvula is midline, oropharynx is clear and moist and mucous membranes are normal. No oropharyngeal exudate.  Skin: Skin is warm and dry. She is not diaphoretic.  Vitals reviewed.     Assessment & Plan:     1. Cerumen impaction, bilateral Left ear was successfully lavaged on Friday. Right ear she has been using debrox in all weekend to soften wax. She comes in today. On exam right ear is now clear and TM WNL. No further treatment required.        Mar Daring, PA-C  [facility]

## 2016-06-04 ENCOUNTER — Other Ambulatory Visit: Payer: Self-pay | Admitting: Internal Medicine

## 2016-06-04 DIAGNOSIS — Z1231 Encounter for screening mammogram for malignant neoplasm of breast: Secondary | ICD-10-CM

## 2016-06-19 ENCOUNTER — Other Ambulatory Visit: Payer: Self-pay | Admitting: Internal Medicine

## 2016-06-19 ENCOUNTER — Ambulatory Visit: Payer: BLUE CROSS/BLUE SHIELD

## 2016-06-19 ENCOUNTER — Ambulatory Visit
Admission: RE | Admit: 2016-06-19 | Discharge: 2016-06-19 | Disposition: A | Discharge status: Home / Self Care | Payer: BLUE CROSS/BLUE SHIELD | Source: Ambulatory Visit | Attending: Internal Medicine | Admitting: Internal Medicine

## 2016-06-19 DIAGNOSIS — Z1231 Encounter for screening mammogram for malignant neoplasm of breast: Secondary | ICD-10-CM | POA: Diagnosis present

## 2017-06-10 ENCOUNTER — Other Ambulatory Visit: Payer: Self-pay | Admitting: Internal Medicine

## 2017-06-10 DIAGNOSIS — Z1231 Encounter for screening mammogram for malignant neoplasm of breast: Secondary | ICD-10-CM

## 2017-08-26 ENCOUNTER — Ambulatory Visit
Admission: RE | Admit: 2017-08-26 | Discharge: 2017-08-26 | Disposition: A | Discharge status: Home / Self Care | Payer: BLUE CROSS/BLUE SHIELD | Source: Ambulatory Visit | Attending: Internal Medicine | Admitting: Internal Medicine

## 2017-08-26 DIAGNOSIS — Z1231 Encounter for screening mammogram for malignant neoplasm of breast: Secondary | ICD-10-CM | POA: Diagnosis not present

## 2018-08-10 ENCOUNTER — Other Ambulatory Visit: Payer: Self-pay | Admitting: Internal Medicine

## 2018-08-10 DIAGNOSIS — Z1231 Encounter for screening mammogram for malignant neoplasm of breast: Secondary | ICD-10-CM

## 2018-10-13 ENCOUNTER — Ambulatory Visit
Admission: RE | Admit: 2018-10-13 | Discharge: 2018-10-13 | Disposition: A | Discharge status: Home / Self Care | Payer: 59 | Source: Ambulatory Visit | Attending: Internal Medicine | Admitting: Internal Medicine

## 2018-10-13 DIAGNOSIS — Z1231 Encounter for screening mammogram for malignant neoplasm of breast: Secondary | ICD-10-CM | POA: Insufficient documentation

## 2019-11-25 ENCOUNTER — Other Ambulatory Visit: Payer: Self-pay | Admitting: Internal Medicine

## 2020-08-16 LAB — EXTERNAL GENERIC LAB PROCEDURE: COLOGUARD: NEGATIVE

## 2020-08-16 LAB — COLOGUARD: COLOGUARD: NEGATIVE

## 2020-10-07 ENCOUNTER — Other Ambulatory Visit: Payer: Self-pay

## 2020-10-07 ENCOUNTER — Other Ambulatory Visit: Payer: 59

## 2020-10-07 DIAGNOSIS — Z20822 Contact with and (suspected) exposure to covid-19: Secondary | ICD-10-CM

## 2020-10-11 LAB — NOVEL CORONAVIRUS, NAA: SARS-CoV-2, NAA: NOT DETECTED

## 2020-11-14 ENCOUNTER — Other Ambulatory Visit: Payer: Self-pay | Admitting: Internal Medicine

## 2020-11-14 DIAGNOSIS — Z1231 Encounter for screening mammogram for malignant neoplasm of breast: Secondary | ICD-10-CM

## 2021-10-30 ENCOUNTER — Other Ambulatory Visit: Payer: Self-pay

## 2021-10-30 ENCOUNTER — Ambulatory Visit
Admission: RE | Admit: 2021-10-30 | Discharge: 2021-10-30 | Disposition: A | Discharge status: Home / Self Care | Payer: Medicare HMO | Source: Ambulatory Visit | Attending: Internal Medicine | Admitting: Internal Medicine

## 2021-10-30 DIAGNOSIS — Z1231 Encounter for screening mammogram for malignant neoplasm of breast: Secondary | ICD-10-CM | POA: Insufficient documentation

## 2022-05-16 IMAGING — MG MM DIGITAL SCREENING BILAT W/ TOMO AND CAD
6 of 10 series · 6 of 30 positions shown · non-contrast
Comparison: Previous exam(s).

CLINICAL DATA: Screening.

EXAM:
DIGITAL SCREENING BILATERAL MAMMOGRAM WITH TOMOSYNTHESIS AND CAD
TECHNIQUE: Bilateral screening digital craniocaudal and mediolateral oblique
mammograms were obtained. Bilateral screening digital breast
tomosynthesis was performed. The images were evaluated with
computer-aided detection.

[R CC synth-2D]
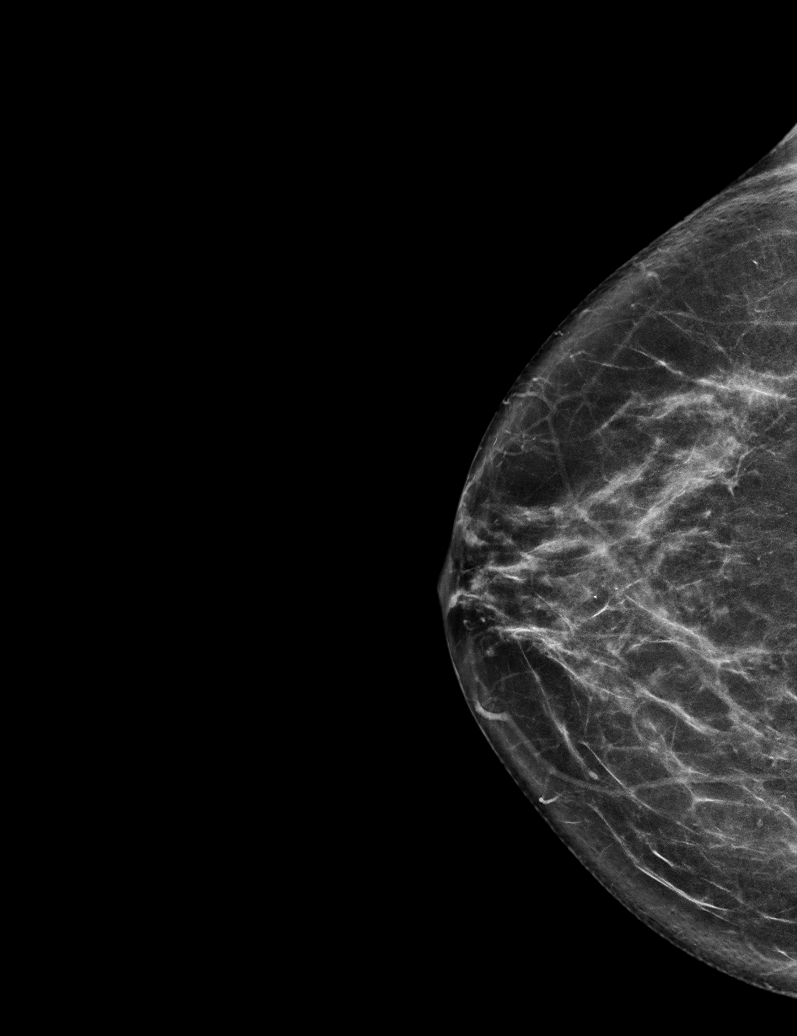

[L MLO synth-2D (1 of 2)]
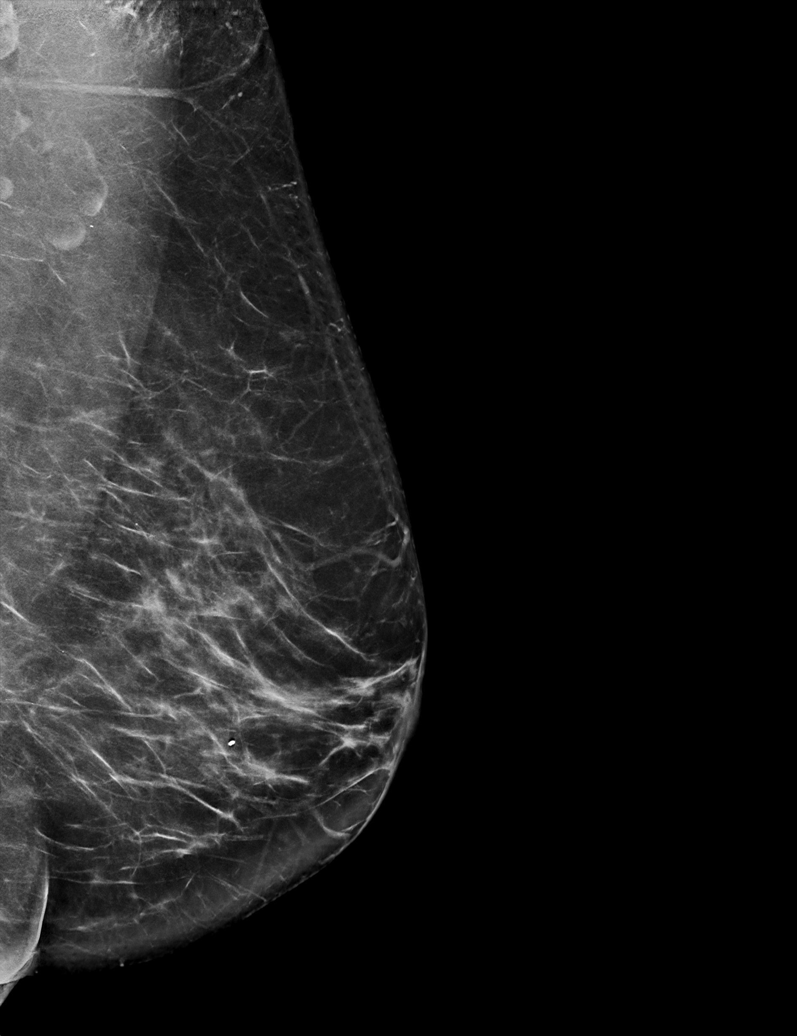

[R MLO synth-2D]
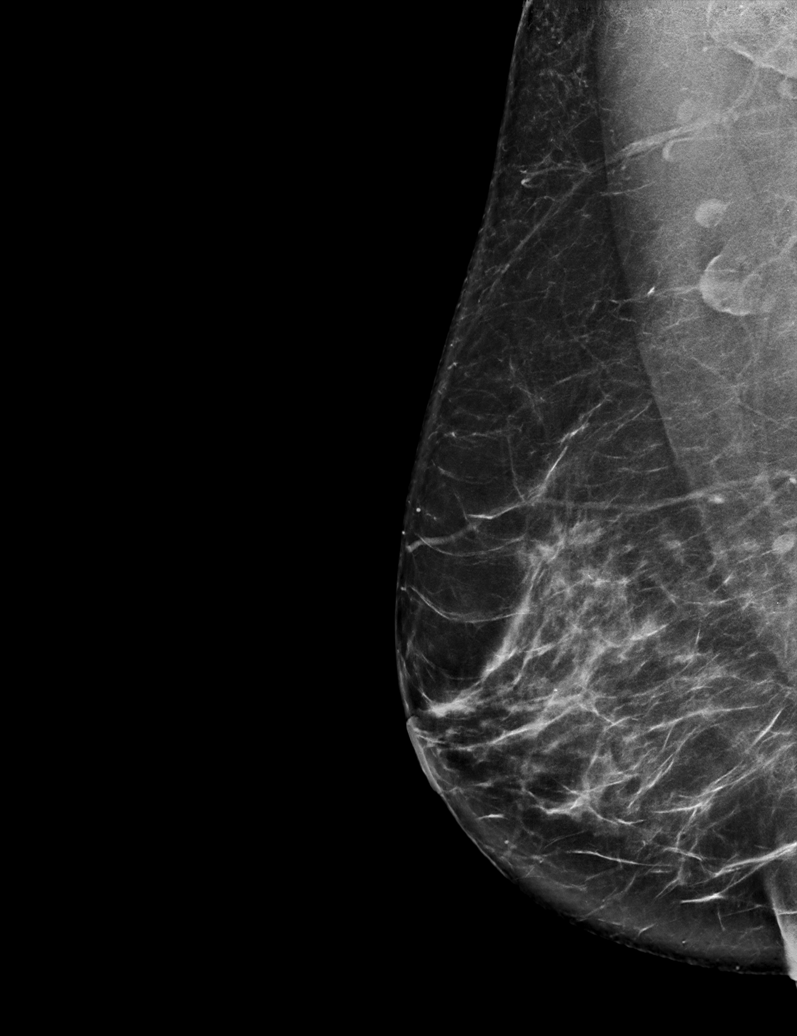

[L CC synth-2D]
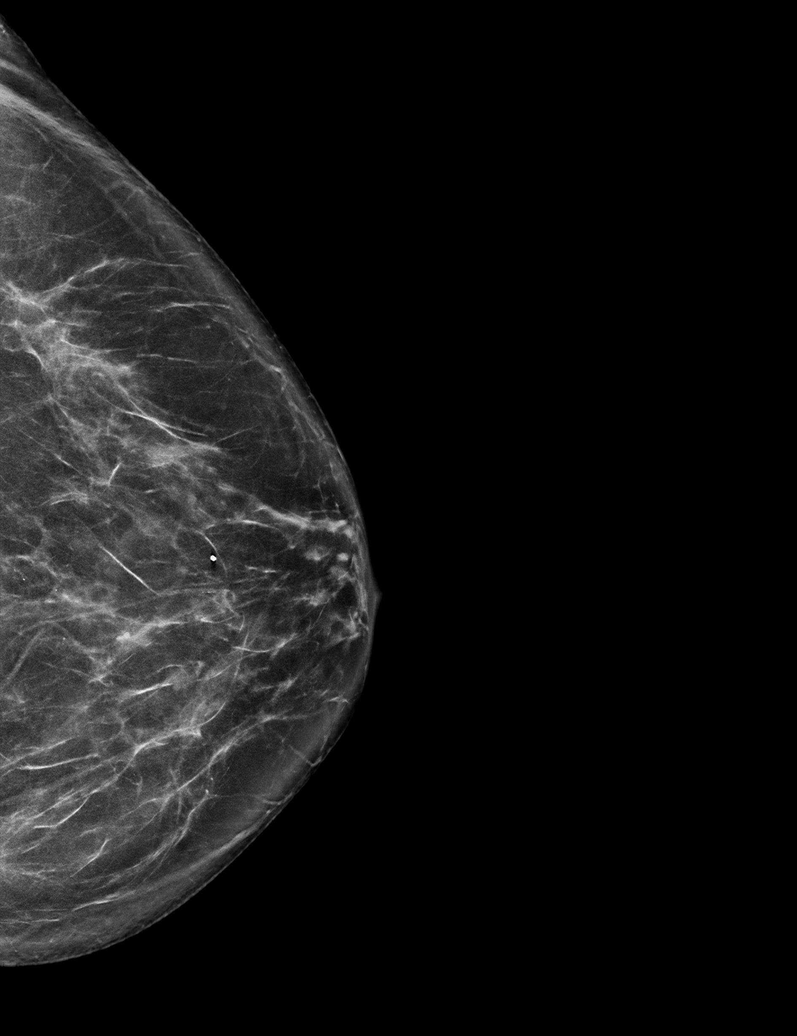

[L MLO synth-2D (2 of 2)]
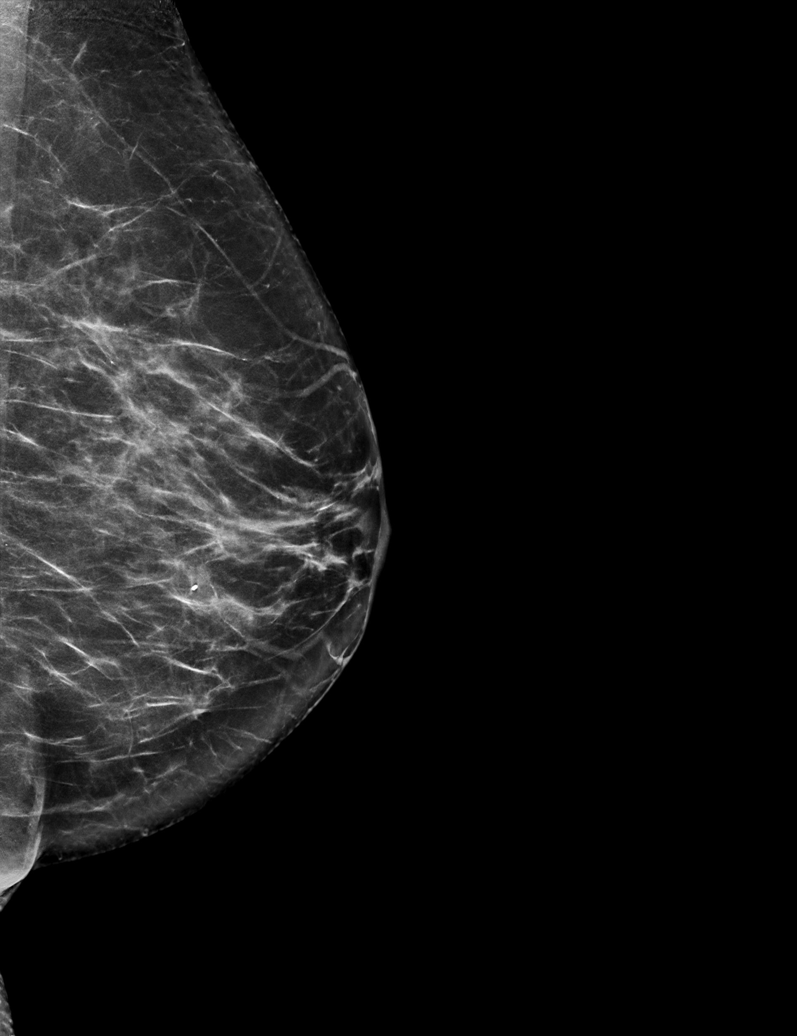

[R MLO tomo · tomo slice 39/78.0]
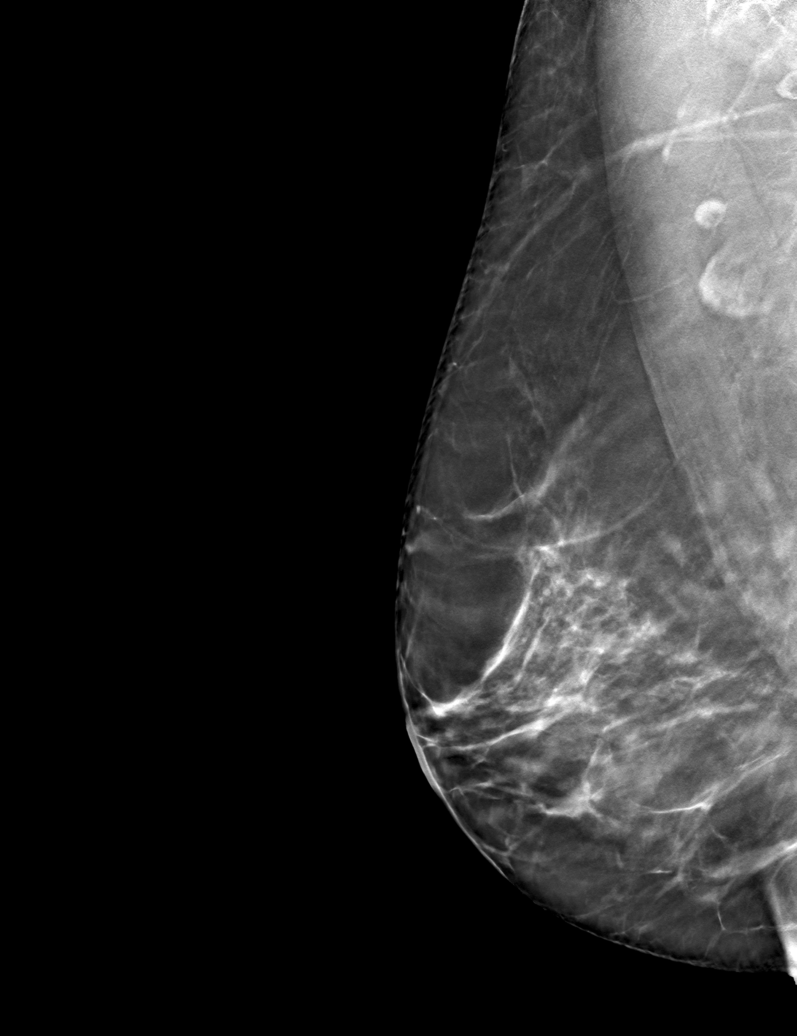

[6 of 30 positions shown; findings below may reference images not displayed]

ACR Breast Density Category b: There are scattered areas of
fibroglandular density.
FINDINGS: There are no findings suspicious for malignancy.
IMPRESSION: No mammographic evidence of malignancy. A result letter of this
screening mammogram will be mailed directly to the patient.

RECOMMENDATION:
Screening mammogram in one year. (Code:51-O-LD2)

BI-RADS CATEGORY  1: Negative.

## 2022-06-05 ENCOUNTER — Other Ambulatory Visit: Payer: Self-pay | Admitting: Neurology

## 2022-06-05 DIAGNOSIS — G43119 Migraine with aura, intractable, without status migrainosus: Secondary | ICD-10-CM

## 2022-07-29 ENCOUNTER — Ambulatory Visit
Admission: RE | Admit: 2022-07-29 | Discharge: 2022-07-29 | Disposition: A | Discharge status: Home / Self Care | Payer: Medicare HMO | Source: Ambulatory Visit | Attending: Neurology | Admitting: Neurology

## 2022-07-29 DIAGNOSIS — G43119 Migraine with aura, intractable, without status migrainosus: Secondary | ICD-10-CM | POA: Insufficient documentation

## 2022-07-29 MED ORDER — GADOBUTROL 1 MMOL/ML IV SOLN
6.0000 mL | Freq: Once | INTRAVENOUS | Status: AC | PRN
  Administered 2022-07-29: 6 mL via INTRAVENOUS

## 2022-08-06 ENCOUNTER — Other Ambulatory Visit: Payer: Medicare HMO

## 2022-10-06 ENCOUNTER — Other Ambulatory Visit: Payer: Self-pay | Admitting: Internal Medicine

## 2022-10-06 DIAGNOSIS — Z1231 Encounter for screening mammogram for malignant neoplasm of breast: Secondary | ICD-10-CM

## 2022-12-22 ENCOUNTER — Ambulatory Visit
Admission: RE | Admit: 2022-12-22 | Discharge: 2022-12-22 | Disposition: A | Discharge status: Home / Self Care | Payer: Medicare HMO | Source: Ambulatory Visit | Attending: Internal Medicine | Admitting: Internal Medicine

## 2022-12-22 DIAGNOSIS — Z1231 Encounter for screening mammogram for malignant neoplasm of breast: Secondary | ICD-10-CM | POA: Diagnosis not present

## 2023-11-12 ENCOUNTER — Other Ambulatory Visit: Payer: Self-pay | Admitting: Internal Medicine

## 2023-11-12 DIAGNOSIS — Z1231 Encounter for screening mammogram for malignant neoplasm of breast: Secondary | ICD-10-CM

## 2024-02-16 ENCOUNTER — Ambulatory Visit
Admission: RE | Admit: 2024-02-16 | Discharge: 2024-02-16 | Disposition: A | Discharge status: Home / Self Care | Source: Ambulatory Visit | Attending: Internal Medicine | Admitting: Internal Medicine

## 2024-02-16 DIAGNOSIS — Z1231 Encounter for screening mammogram for malignant neoplasm of breast: Secondary | ICD-10-CM | POA: Diagnosis present
